# Patient Record
Sex: Male | Born: 1990
Health system: Southern US, Community
[De-identification: ages and names within clinical notes are randomized; demographics above are authoritative.]

## PROBLEM LIST (undated history)

## (undated) DIAGNOSIS — R011 Cardiac murmur, unspecified: Secondary | ICD-10-CM

## (undated) DIAGNOSIS — T7840XA Allergy, unspecified, initial encounter: Secondary | ICD-10-CM

## (undated) DIAGNOSIS — N179 Acute kidney failure, unspecified: Secondary | ICD-10-CM

## (undated) HISTORY — DX: Cardiac murmur, unspecified: R01.1

## (undated) HISTORY — DX: Acute kidney failure, unspecified: N17.9

## (undated) HISTORY — DX: Allergy, unspecified, initial encounter: T78.40XA

---
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
## 2011-09-19 ENCOUNTER — Ambulatory Visit (INDEPENDENT_AMBULATORY_CARE_PROVIDER_SITE_OTHER): Payer: BC Managed Care – PPO | Admitting: Family Medicine

## 2011-09-19 ENCOUNTER — Ambulatory Visit: Payer: BC Managed Care – PPO

## 2011-09-19 VITALS — BP 103/69 | HR 75 | Temp 98.5°F | Resp 16 | Ht 67.0 in | Wt 142.0 lb

## 2011-09-19 DIAGNOSIS — IMO0002 Reserved for concepts with insufficient information to code with codable children: Secondary | ICD-10-CM

## 2011-09-19 DIAGNOSIS — S86912A Strain of unspecified muscle(s) and tendon(s) at lower leg level, left leg, initial encounter: Secondary | ICD-10-CM

## 2011-09-19 DIAGNOSIS — M25562 Pain in left knee: Secondary | ICD-10-CM

## 2011-09-19 DIAGNOSIS — M25569 Pain in unspecified knee: Secondary | ICD-10-CM

## 2011-09-19 MED ORDER — MELOXICAM 15 MG PO TABS: 15.0000 mg | ORAL_TABLET | Freq: Every day | ORAL | Status: AC

## 2011-09-19 NOTE — Progress Notes (Signed)
  Subjective:    Patient ID: Micheal Wilson, male    DOB: 04/22/1990, 21 y.o.   MRN: [id number]  HPI  Pt presents to clinic with L knee pain for 2 days - he was sitting and he went to standing position and felt a pop and had immediate swelling in his L knee.  Since then the pain has improved and the swelling has reduced but he is concerned because it is still feeling like it is going to give away.  He has never had injury to the knee before.  He has used no medications.  Review of Systems  Musculoskeletal: Positive for joint swelling, arthralgias and gait problem.       Objective:   Physical Exam  Vitals reviewed. Constitutional: He is oriented to person, place, and time. He appears well-developed and well-nourished.  HENT:  Head: Normocephalic and atraumatic.  Right Ear: External ear normal.  Left Ear: External ear normal.  Pulmonary/Chest: Effort normal.  Musculoskeletal:       Left knee: He exhibits effusion (mild). He exhibits normal range of motion, no erythema, no LCL laxity, normal patellar mobility, no bony tenderness, normal meniscus and no MCL laxity. tenderness found. Medial joint line (mild TTP) and patellar tendon (mild TTP) tenderness noted. No lateral joint line, no MCL and no LCL tenderness noted.  Neurological: He is alert and oriented to person, place, and time.  Skin: Skin is warm and dry.  Psychiatric: He has a normal mood and affect. His behavior is normal. Thought content normal.    [facility] reading (PRIMARY) by  Dr. Milus Glazier.  Nl xray.        Assessment & Plan:   1. Knee pain, left  DG Knee Complete 4 Views Left, meloxicam (MOBIC) 15 MG tablet  2. Strain of knee and leg, left     Pt to rest knee and use ice prn.

## 2011-10-21 ENCOUNTER — Ambulatory Visit (INDEPENDENT_AMBULATORY_CARE_PROVIDER_SITE_OTHER): Payer: BC Managed Care – PPO | Admitting: Emergency Medicine

## 2011-10-21 VITALS — BP 91/56 | HR 58 | Temp 98.3°F | Resp 16 | Ht 66.0 in | Wt 142.0 lb

## 2011-10-21 DIAGNOSIS — J019 Acute sinusitis, unspecified: Secondary | ICD-10-CM

## 2011-10-21 DIAGNOSIS — J029 Acute pharyngitis, unspecified: Secondary | ICD-10-CM

## 2011-10-21 LAB — POCT RAPID STREP A (OFFICE): Rapid Strep A Screen: NEGATIVE

## 2011-10-21 MED ORDER — AMOXICILLIN 875 MG PO TABS: 875.0000 mg | ORAL_TABLET | Freq: Two times a day (BID) | ORAL | Status: AC

## 2011-10-21 MED ORDER — FLUTICASONE PROPIONATE 50 MCG/ACT NA SUSP: 2.0000 | Freq: Every day | NASAL | Status: DC

## 2011-10-21 NOTE — Patient Instructions (Signed)

## 2011-10-21 NOTE — Progress Notes (Signed)
  Subjective:    Patient ID: Micheal Wilson, male    DOB: 1990/05/18, 21 y.o.   MRN: [id number]  HPI patient states that every year this time a year he has problems. 2 sinus congestion and then develops tonsillar swelling. He has been blowing purulent drainage out of his nose and also coughing up small amounts of yellowish phlegm   Review of Systems     Objective:   Physical Exam TMs are clear. Nose is congested. Tonsils are 2+ and symmetrical anterior posterior cervical nodes are nonenlarged. Is clear to both auscultation and percussion   Results for orders placed in visit on 10/21/11  POCT RAPID STREP A (OFFICE)      Component Value Range   Rapid Strep A Screen Negative  Negative       Assessment & Plan:  Patient seems to start with allergic rhinitis and then develops a sinusitis. Will cover with amoxicillin twice a day have also I have prescribed  Flonase which he has taken in the past but does not like to use.

## 2012-09-21 ENCOUNTER — Ambulatory Visit: Payer: BC Managed Care – PPO | Admitting: Physician Assistant

## 2012-09-21 VITALS — BP 102/58 | HR 70 | Temp 97.9°F | Resp 16 | Ht 67.0 in | Wt 145.6 lb

## 2012-09-21 DIAGNOSIS — J029 Acute pharyngitis, unspecified: Secondary | ICD-10-CM

## 2012-09-21 MED ORDER — AMOXICILLIN 875 MG PO TABS: 875.0000 mg | ORAL_TABLET | Freq: Two times a day (BID) | ORAL | Status: DC

## 2012-09-21 NOTE — Progress Notes (Signed)
  Subjective:    Patient ID: Micheal Wilson, male    DOB: [birth year], 22 y.o.   MRN: [id number]  HPI 22 year old male presents with 6 day history of sore throat, slight nasal drainage, and mild, dry cough.  States symptoms started 2 days after an exposure to strep. Admits he has had chills and subjective fever.  Denies otalgia, nausea, vomiting, sinus pain, headache, SOB, or wheezing.  He has not taken any OTC medications for this.  No significant hx of strep since childhood. Otherwise healthy with no other concerns today.     Review of Systems  Constitutional: Positive for fever (subjective) and chills.  HENT: Positive for sore throat, rhinorrhea and postnasal drip. Negative for ear pain, congestion and neck stiffness.   Respiratory: Positive for cough. Negative for shortness of breath and wheezing.   Gastrointestinal: Negative for nausea, vomiting and abdominal pain.  Neurological: Negative for headaches.       Objective:   Physical Exam  Constitutional: He is oriented to person, place, and time. He appears well-developed and well-nourished.  HENT:  Head: Normocephalic and atraumatic.  Right Ear: Hearing, tympanic membrane, external ear and ear canal normal.  Left Ear: Hearing, tympanic membrane, external ear and ear canal normal.  Mouth/Throat: Uvula is midline and mucous membranes are normal. Posterior oropharyngeal erythema present. No oropharyngeal exudate or tonsillar abscesses.  Eyes: Conjunctivae are normal.  Neck: Normal range of motion. Neck supple.  Cardiovascular: Normal rate, regular rhythm and normal heart sounds.   Pulmonary/Chest: Effort normal and breath sounds normal.  Lymphadenopathy:    He has no cervical adenopathy.  Neurological: He is alert and oriented to person, place, and time.  Psychiatric: He has a normal mood and affect. His behavior is normal. Judgment and thought content normal.          Assessment & Plan:  Acute pharyngitis - Plan: amoxicillin  (AMOXIL) 875 MG tablet  Patient declined strep test due to cost Start amoxicillin 875 mg bid x 10 days Increase fluids and rest Out of work today. Ok to go tomorrow as long as he is afebrile Follow up if symptoms worsen or fail to improve.

## 2012-10-10 ENCOUNTER — Ambulatory Visit (INDEPENDENT_AMBULATORY_CARE_PROVIDER_SITE_OTHER): Payer: BC Managed Care – PPO | Admitting: Emergency Medicine

## 2012-10-10 ENCOUNTER — Ambulatory Visit: Payer: BC Managed Care – PPO

## 2012-10-10 VITALS — BP 110/70 | HR 80 | Temp 97.9°F | Resp 16 | Ht 66.75 in | Wt 150.6 lb

## 2012-10-10 DIAGNOSIS — S139XXA Sprain of joints and ligaments of unspecified parts of neck, initial encounter: Secondary | ICD-10-CM

## 2012-10-10 DIAGNOSIS — K5900 Constipation, unspecified: Secondary | ICD-10-CM

## 2012-10-10 DIAGNOSIS — S335XXA Sprain of ligaments of lumbar spine, initial encounter: Secondary | ICD-10-CM

## 2012-10-10 DIAGNOSIS — S239XXA Sprain of unspecified parts of thorax, initial encounter: Secondary | ICD-10-CM

## 2012-10-10 MED ORDER — NAPROXEN SODIUM 550 MG PO TABS: 550.0000 mg | ORAL_TABLET | Freq: Two times a day (BID) | ORAL | Status: DC

## 2012-10-10 MED ORDER — CYCLOBENZAPRINE HCL 10 MG PO TABS: 10.0000 mg | ORAL_TABLET | Freq: Three times a day (TID) | ORAL | Status: DC | PRN

## 2012-10-10 MED ORDER — POLYETHYLENE GLYCOL 3350 17 GM/SCOOP PO POWD: 17.0000 g | Freq: Every day | ORAL | Status: DC
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
## 2012-10-10 NOTE — Patient Instructions (Addendum)
Cervical Sprain A cervical sprain is an injury in the neck in which the ligaments are stretched or torn. The ligaments are the tissues that hold the bones of the neck (vertebrae) in place.Cervical sprains can range from very mild to very severe. Most cervical sprains get better in 1 to 3 weeks, but it depends on the cause and extent of the injury. Severe cervical sprains can cause the neck vertebrae to be unstable. This can lead to damage of the spinal cord and can result in serious nervous system problems. Your caregiver will determine whether your cervical sprain is mild or severe. CAUSES  Severe cervical sprains may be caused by:  Contact sport injuries (football, rugby, wrestling, hockey, auto racing, gymnastics, diving, martial arts, boxing).  Motor vehicle collisions.  Whiplash injuries. This means the neck is forcefully whipped backward and forward.  Falls. Mild cervical sprains may be caused by:   Awkward positions, such as cradling a telephone between your ear and shoulder.  Sitting in a chair that does not offer proper support.  Working at a poorly designed computer station.  Activities that require looking up or down for long periods of time. SYMPTOMS   Pain, soreness, stiffness, or a burning sensation in the front, back, or sides of the neck. This discomfort may develop immediately after injury or it may develop slowly and not begin for 24 hours or more after an injury.  Pain or tenderness directly in the middle of the back of the neck.  Shoulder or upper back pain.  Limited ability to move the neck.  Headache.  Dizziness.  Weakness, numbness, or tingling in the hands or arms.  Muscle spasms.  Difficulty swallowing or chewing.  Tenderness and swelling of the neck. DIAGNOSIS  Most of the time, your caregiver can diagnose this problem by taking your history and doing a physical exam. Your caregiver will ask about any known problems, such as arthritis in the neck  or a previous neck injury. X-rays may be taken to find out if there are any other problems, such as problems with the bones of the neck. However, an X-ray often does not reveal the full extent of a cervical sprain. Other tests such as a computed tomography (CT) scan or magnetic resonance imaging (MRI) may be needed. TREATMENT  Treatment depends on the severity of the cervical sprain. Mild sprains can be treated with rest, keeping the neck in place (immobilization), and pain medicines. Severe cervical sprains need immediate immobilization and an appointment with an orthopedist or neurosurgeon. Several treatment options are available to help with pain, muscle spasms, and other symptoms. Your caregiver may prescribe:  Medicines, such as pain relievers, numbing medicines, or muscle relaxants.  Physical therapy. This can include stretching exercises, strengthening exercises, and posture training. Exercises and improved posture can help stabilize the neck, strengthen muscles, and help stop symptoms from returning.  A neck collar to be worn for short periods of time. Often, these collars are worn for comfort. However, certain collars may be worn to protect the neck and prevent further worsening of a serious cervical sprain. HOME CARE INSTRUCTIONS   Put ice on the injured area.  Put ice in a plastic bag.  Place a towel between your skin and the bag.  Leave the ice on for 15-20 minutes, 3-4 times a day.  Only take over-the-counter or prescription medicines for pain, discomfort, or fever as directed by your caregiver.  Keep all follow-up appointments as directed by your caregiver.  Keep all   physical therapy appointments as directed by your caregiver.  If a neck collar is prescribed, wear it as directed by your caregiver.  Do not drive while wearing a neck collar.  Make any needed adjustments to your work station to promote good posture.  Avoid positions and activities that make your symptoms  worse.  Warm up and stretch before being active to help prevent problems. SEEK MEDICAL CARE IF:   Your pain is not controlled with medicine.  You are unable to decrease your pain medicine over time as planned.  Your activity level is not improving as expected. SEEK IMMEDIATE MEDICAL CARE IF:   You develop any bleeding, stomach upset, or signs of an allergic reaction to your medicine.  Your symptoms get worse.  You develop new, unexplained symptoms.  You have numbness, tingling, weakness, or paralysis in any part of your body. MAKE SURE YOU:   Understand these instructions.  Will watch your condition.  Will get help right away if you are not doing well or get worse. Document Released: 11/30/2006 Document Revised: 04/27/2011 Document Reviewed: 11/05/2010 [facility] Patient Information 2014 [place], [place]. Constipation, Adult Constipation is when a person has fewer than 3 bowel movements a week; has difficulty having a bowel movement; or has stools that are dry, hard, or larger than normal. As people grow older, constipation is more common. If you try to fix constipation with medicines that make you have a bowel movement (laxatives), the problem may get worse. Long-term laxative use may cause the muscles of the colon to become weak. A low-fiber diet, not taking in enough fluids, and taking certain medicines may make constipation worse. CAUSES   Certain medicines, such as antidepressants, pain medicine, iron supplements, antacids, and water pills.   Certain diseases, such as diabetes, irritable bowel syndrome (IBS), thyroid disease, or depression.   Not drinking enough water.   Not eating enough fiber-rich foods.   Stress or travel.  Lack of physical activity or exercise.  Not going to the restroom when there is the urge to have a bowel movement.  Ignoring the urge to have a bowel movement.  Using laxatives too much. SYMPTOMS   Having fewer than 3 bowel movements a  week.   Straining to have a bowel movement.   Having hard, dry, or larger than normal stools.   Feeling full or bloated.   Pain in the lower abdomen.  Not feeling relief after having a bowel movement. DIAGNOSIS  Your caregiver will take a medical history and perform a physical exam. Further testing may be done for severe constipation. Some tests may include:   A barium enema X-ray to examine your rectum, colon, and sometimes, your small intestine.  A sigmoidoscopy to examine your lower colon.  A colonoscopy to examine your entire colon. TREATMENT  Treatment will depend on the severity of your constipation and what is causing it. Some dietary treatments include drinking more fluids and eating more fiber-rich foods. Lifestyle treatments may include regular exercise. If these diet and lifestyle recommendations do not help, your caregiver may recommend taking over-the-counter laxative medicines to help you have bowel movements. Prescription medicines may be prescribed if over-the-counter medicines do not work.  HOME CARE INSTRUCTIONS   Increase dietary fiber in your diet, such as fruits, vegetables, whole grains, and beans. Limit high-fat and processed sugars in your diet, such as [place] fries, hamburgers, cookies, candies, and soda.   A fiber supplement may be added to your diet if you cannot get enough fiber  from foods.   Drink enough fluids to keep your urine clear or pale yellow.   Exercise regularly or as directed by your caregiver.   Go to the restroom when you have the urge to go. Do not hold it.  Only take medicines as directed by your caregiver. Do not take other medicines for constipation without talking to your caregiver first. SEEK IMMEDIATE MEDICAL CARE IF:   You have bright red blood in your stool.   Your constipation lasts for more than 4 days or gets worse.   You have abdominal or rectal pain.   You have thin, pencil-like stools.  You have  unexplained weight loss. MAKE SURE YOU:   Understand these instructions.  Will watch your condition.  Will get help right away if you are not doing well or get worse. Document Released: 11/01/2003 Document Revised: 04/27/2011 Document Reviewed: 01/06/2011 [facility] Patient Information 2014 [place], [place].

## 2012-10-24 ENCOUNTER — Ambulatory Visit (INDEPENDENT_AMBULATORY_CARE_PROVIDER_SITE_OTHER): Payer: BC Managed Care – PPO | Admitting: Family Medicine

## 2012-10-24 VITALS — BP 112/74 | HR 71 | Temp 99.3°F | Resp 16 | Ht 67.0 in | Wt 145.0 lb

## 2012-10-24 DIAGNOSIS — R197 Diarrhea, unspecified: Secondary | ICD-10-CM

## 2012-10-24 DIAGNOSIS — M545 Low back pain, unspecified: Secondary | ICD-10-CM

## 2012-10-24 DIAGNOSIS — R109 Unspecified abdominal pain: Secondary | ICD-10-CM

## 2012-10-24 DIAGNOSIS — K529 Noninfective gastroenteritis and colitis, unspecified: Secondary | ICD-10-CM

## 2012-10-24 DIAGNOSIS — Z202 Contact with and (suspected) exposure to infections with a predominantly sexual mode of transmission: Secondary | ICD-10-CM

## 2012-10-24 DIAGNOSIS — K921 Melena: Secondary | ICD-10-CM

## 2012-10-24 DIAGNOSIS — Z2089 Contact with and (suspected) exposure to other communicable diseases: Secondary | ICD-10-CM

## 2012-10-24 DIAGNOSIS — R51 Headache: Secondary | ICD-10-CM

## 2012-10-24 DIAGNOSIS — K5289 Other specified noninfective gastroenteritis and colitis: Secondary | ICD-10-CM

## 2012-10-24 DIAGNOSIS — J029 Acute pharyngitis, unspecified: Secondary | ICD-10-CM

## 2012-10-24 LAB — POCT URINALYSIS DIPSTICK
Bilirubin, UA: NEGATIVE
Glucose, UA: NEGATIVE
Leukocytes, UA: NEGATIVE
Nitrite, UA: NEGATIVE

## 2012-10-24 LAB — POCT CBC
MCH, POC: 30.7 pg (ref 27–31.2)
MCV: 92.3 fL (ref 80–97)
MID (cbc): 0.7 (ref 0–0.9)
POC LYMPH PERCENT: 20.9 %L (ref 10–50)
Platelet Count, POC: 181 10*3/uL (ref 142–424)
RBC: 5.18 M/uL (ref 4.69–6.13)
WBC: 9.6 10*3/uL (ref 4.6–10.2)

## 2012-10-24 LAB — POCT UA - MICROSCOPIC ONLY
Bacteria, U Microscopic: NEGATIVE
RBC, urine, microscopic: NEGATIVE
WBC, Ur, HPF, POC: NEGATIVE

## 2012-10-24 LAB — IFOBT (OCCULT BLOOD): IFOBT: POSITIVE

## 2012-10-24 MED ORDER — TRAMADOL HCL 50 MG PO TABS: 50.0000 mg | ORAL_TABLET | Freq: Three times a day (TID) | ORAL | Status: DC | PRN

## 2012-10-24 MED ORDER — PROMETHAZINE HCL 25 MG PO TABS: 25.0000 mg | ORAL_TABLET | Freq: Three times a day (TID) | ORAL | Status: DC | PRN
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
## 2012-10-24 NOTE — Patient Instructions (Addendum)
Your urine tests are normal and you do not have any signs of infection in your blood but you do have blood in your stool.  I am wondering if you have infection or inflammation as a cause of your current illness.  Lets treat your nausea and your pain. Almost all of these are viral and resolve within several days on their own if you can keep well hydrated. However, it would be good for you to come back to make sure that the microscopic blood in your stool clears up.  If not, we should refer you to a GI doctor for further evaluation and consider further testing in the meantime.  Viral Gastroenteritis Viral gastroenteritis is also known as stomach flu. This condition affects the stomach and intestinal tract. It can cause sudden diarrhea and vomiting. The illness typically lasts 3 to 8 days. Most people develop an immune response that eventually gets rid of the virus. While this natural response develops, the virus can make you quite ill. CAUSES  Many different viruses can cause gastroenteritis, such as rotavirus or noroviruses. You can catch one of these viruses by consuming contaminated food or water. You may also catch a virus by sharing utensils or other personal items with an infected person or by touching a contaminated surface. SYMPTOMS  The most common symptoms are diarrhea and vomiting. These problems can cause a severe loss of body fluids (dehydration) and a body salt (electrolyte) imbalance. Other symptoms may include:  Fever.  Headache.  Fatigue.  Abdominal pain. DIAGNOSIS  Your caregiver can usually diagnose viral gastroenteritis based on your symptoms and a physical exam. A stool sample may also be taken to test for the presence of viruses or other infections. TREATMENT  This illness typically goes away on its own. Treatments are aimed at rehydration. The most serious cases of viral gastroenteritis involve vomiting so severely that you are not able to keep fluids down. In these cases,  fluids must be given through an intravenous line (IV). HOME CARE INSTRUCTIONS   Drink enough fluids to keep your urine clear or pale yellow. Drink small amounts of fluids frequently and increase the amounts as tolerated.  Ask your caregiver for specific rehydration instructions.  Avoid:  Foods high in sugar.  Alcohol.  Carbonated drinks.  Tobacco.  Juice.  Caffeine drinks.  Extremely hot or cold fluids.  Fatty, greasy foods.  Too much intake of anything at one time.  Dairy products until 24 to 48 hours after diarrhea stops.  You may consume probiotics. Probiotics are active cultures of beneficial bacteria. They may lessen the amount and number of diarrheal stools in adults. Probiotics can be found in yogurt with active cultures and in supplements.  Wash your hands well to avoid spreading the virus.  Only take over-the-counter or prescription medicines for pain, discomfort, or fever as directed by your caregiver. Do not give aspirin to children. Antidiarrheal medicines are not recommended.  Ask your caregiver if you should continue to take your regular prescribed and over-the-counter medicines.  Keep all follow-up appointments as directed by your caregiver. SEEK IMMEDIATE MEDICAL CARE IF:   You are unable to keep fluids down.  You do not urinate at least once every 6 to 8 hours.  You develop shortness of breath.  You notice blood in your stool or vomit. This may look like coffee grounds.  You have abdominal pain that increases or is concentrated in one small area (localized).  You have persistent vomiting or diarrhea.  You  have a fever.  The patient is a child younger than 3 months, and he or she has a fever.  The patient is a child older than 3 months, and he or she has a fever and persistent symptoms.  The patient is a child older than 3 months, and he or she has a fever and symptoms suddenly get worse.  The patient is a baby, and he or she has no tears  when crying. MAKE SURE YOU:   Understand these instructions.  Will watch your condition.  Will get help right away if you are not doing well or get worse. Document Released: 02/02/2005 Document Revised: 04/27/2011 Document Reviewed: 11/19/2010 [facility] Patient Information 2014 [place], [place].

## 2012-10-25 LAB — COMPREHENSIVE METABOLIC PANEL
AST: 26 U/L (ref 0–37)
BUN: 17 mg/dL (ref 6–23)
Calcium: 10.2 mg/dL (ref 8.4–10.5)
Chloride: 105 mEq/L (ref 96–112)
Creat: 3.18 mg/dL — ABNORMAL HIGH (ref 0.50–1.35)

## 2012-10-25 LAB — LIPASE: Lipase: 14 U/L (ref 0–75)

## 2012-10-25 LAB — HIV ANTIBODY (ROUTINE TESTING W REFLEX): HIV: NONREACTIVE

## 2012-10-25 LAB — EPSTEIN-BARR VIRUS VCA ANTIBODY PANEL
EBV NA IgG: 122 U/mL — ABNORMAL HIGH (ref <18.0)
EBV VCA IgG: 24.6 U/mL — ABNORMAL HIGH (ref <18.0)

## 2012-10-25 LAB — HEPATITIS C ANTIBODY: HCV Ab: NEGATIVE

## 2012-10-27 LAB — GC/CHLAMYDIA PROBE AMP: GC Probe RNA: NEGATIVE

## 2012-10-29 ENCOUNTER — Encounter: Payer: Self-pay | Admitting: Family Medicine

## 2012-10-29 ENCOUNTER — Telehealth: Payer: Self-pay | Admitting: Family Medicine

## 2012-10-29 NOTE — Telephone Encounter (Signed)
Spoke with patient patient went to [facility] Tuesday 10-25-12 and they treated him he has to follow back up with primary doctor Tuesday 11-01-12

## 2012-11-01 DIAGNOSIS — N179 Acute kidney failure, unspecified: Secondary | ICD-10-CM | POA: Insufficient documentation
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
## 2017-10-25 ENCOUNTER — Ambulatory Visit: Payer: 59 | Admitting: Family Medicine

## 2017-10-25 ENCOUNTER — Encounter: Payer: Self-pay | Admitting: Family Medicine

## 2017-10-25 VITALS — BP 110/70 | HR 77 | Temp 98.0°F | Ht 67.25 in | Wt 153.4 lb

## 2017-10-25 DIAGNOSIS — Z8639 Personal history of other endocrine, nutritional and metabolic disease: Secondary | ICD-10-CM | POA: Diagnosis not present

## 2017-10-25 DIAGNOSIS — Z0000 Encounter for general adult medical examination without abnormal findings: Secondary | ICD-10-CM

## 2017-10-25 DIAGNOSIS — Z23 Encounter for immunization: Secondary | ICD-10-CM

## 2017-10-25 LAB — CBC
HCT: 44.4 % (ref 39.0–52.0)
Hemoglobin: 15.8 g/dL (ref 13.0–17.0)
MCHC: 35.6 g/dL (ref 30.0–36.0)
MCV: 82.7 fl (ref 78.0–100.0)
PLATELETS: 208 10*3/uL (ref 150.0–400.0)
RBC: 5.38 Mil/uL (ref 4.22–5.81)
RDW: 13 % (ref 11.5–15.5)
WBC: 5.6 10*3/uL (ref 4.0–10.5)

## 2017-10-25 LAB — COMPREHENSIVE METABOLIC PANEL
ALK PHOS: 75 U/L (ref 39–117)
ALT: 60 U/L — AB (ref 0–53)
AST: 33 U/L (ref 0–37)
Albumin: 4.6 g/dL (ref 3.5–5.2)
BILIRUBIN TOTAL: 0.7 mg/dL (ref 0.2–1.2)
BUN: 9 mg/dL (ref 6–23)
CO2: 29 mEq/L (ref 19–32)
CREATININE: 1.18 mg/dL (ref 0.40–1.50)
Calcium: 9.6 mg/dL (ref 8.4–10.5)
Chloride: 105 mEq/L (ref 96–112)
GFR: 77.79 mL/min (ref >60.00)
GLUCOSE: 106 mg/dL — AB (ref 70–99)
Potassium: 4.4 mEq/L (ref 3.5–5.1)
Sodium: 139 mEq/L (ref 135–145)
TOTAL PROTEIN: 7.2 g/dL (ref 6.0–8.3)

## 2017-10-25 LAB — VITAMIN B12: VITAMIN B 12: 628 pg/mL (ref 211–911)

## 2017-10-25 LAB — TSH: TSH: 2.33 u[IU]/mL (ref 0.35–4.50)

## 2017-10-25 NOTE — Assessment & Plan Note (Signed)
Well adult Orders Placed This Encounter  Procedures  . Flu Vaccine QUAD 6+ mos PF IM (Fluarix Quad PF)  . Tdap vaccine greater than or equal to 28yo IM  . Comp Met (CMET)  . CBC  . TSH  . B12  Immunizations: Tdap and influenza Screenings:  None indicated at this time.  Anticipatory guidance/Risk factor reduction:  Per AVS
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
## 2017-10-25 NOTE — Patient Instructions (Signed)

## 2017-10-28 NOTE — Progress Notes (Signed)
-  Mild elevation in liver enzymes.  Would recommend low fat diet and limiting any EtOH intake.  -Other labs look ok.

## 2018-01-27 ENCOUNTER — Ambulatory Visit: Payer: 59 | Admitting: Family Medicine

## 2018-01-27 ENCOUNTER — Ambulatory Visit: Payer: Self-pay

## 2018-01-27 ENCOUNTER — Encounter: Payer: Self-pay | Admitting: Family Medicine

## 2018-01-27 ENCOUNTER — Telehealth: Payer: Self-pay | Admitting: Family Medicine

## 2018-01-27 VITALS — BP 122/74 | HR 85 | Temp 98.5°F | Ht 67.25 in | Wt 154.0 lb

## 2018-01-27 DIAGNOSIS — F4322 Adjustment disorder with anxiety: Secondary | ICD-10-CM | POA: Diagnosis not present

## 2018-01-27 DIAGNOSIS — K120 Recurrent oral aphthae: Secondary | ICD-10-CM | POA: Diagnosis not present

## 2018-01-27 DIAGNOSIS — J0100 Acute maxillary sinusitis, unspecified: Secondary | ICD-10-CM | POA: Diagnosis not present

## 2018-01-27 MED ORDER — AZITHROMYCIN 250 MG PO TABS: ORAL_TABLET | ORAL | 0 refills | Status: DC

## 2018-01-27 MED ORDER — MAGIC MOUTHWASH: 5.0000 mL | Freq: Three times a day (TID) | ORAL | 0 refills | Status: DC | PRN

## 2018-01-27 MED ORDER — ALPRAZOLAM 0.5 MG PO TABS: 0.2500 mg | ORAL_TABLET | Freq: Every evening | ORAL | 0 refills | Status: DC | PRN

## 2018-01-27 MED FILL — ALPRAZolam 0.5 MG TABS: 0.5 | 30 days supply | Qty: 30 | Fill #0

## 2018-01-27 MED FILL — DUKE'S MOUTHWASH: 10 days supply | Qty: 150 | Fill #0

## 2018-01-27 MED FILL — AZITHROMYCIN 250 MG TABLET: 250 | 5 days supply | Qty: 6 | Fill #0

## 2018-01-27 NOTE — Assessment & Plan Note (Signed)
-  Rx for magic mouthwash -Likely related to stress.

## 2018-01-27 NOTE — Telephone Encounter (Signed)
Provided information with pharmacy tech will fill [facility] Magic Mouthwash.

## 2018-01-27 NOTE — Telephone Encounter (Signed)
Copied from CRM [phone]. Topic: Quick Communication - Rx Refill/Question >> Jan 27, 2018 11:08 AM Stephannie LiSimmons, Tamber Burtch L, NT wrote: Medication: magic mouthwash SOLN , ( the pharmacy called and would like to know which ingredients to use for the mouth wash , ) please advise   Has the patient contacted their pharmacy? yes  (Agent: If no, request that the patient contact the pharmacy for the refill. (Agent: If yes, when and what did the pharmacy advise?  Preferred Pharmacy (with phone number or street name [facility] [place] [address] [phone] (Phone) [phone] (Fax)    Agent: Please be advised that RX refills may take up to 3 business days. We ask that you follow-up with your pharmacy.

## 2018-01-27 NOTE — Progress Notes (Signed)
Micheal Kothari - 27 y.o. male MRN [id number]  Date of birth: 11/11/1990  Subjective Chief Complaint  Patient presents with  . Sinusitis    Ongong for one week-blisters in mouth and sore throat.  Marland Kitchen. Anxiety    Son has been in NICU-can't sleep    HPI Micheal Wilson is a 27 y.o. male here today with complaint of:  -Sinus pain:  Reports sinus pain and congestion for the past several days.  Pain located over maxillary sinuses. Has had thick, yellow drainage.  He denies fever, chills, chest pain, shortness of breath, body aches.   -Anxiety:  Son was born at 6126 weeks and [facility].  Has had significant anxiety and difficulty sleeping since this time.  Unable to stop thinking about his son to fall asleep.  Anxiety is a little better during the day when he is working as this takes his mind off things some.  He is having some tightness in his neck and has ulcers on his tongue that he thinks may be related to stress.   ROS:  A comprehensive ROS was completed and negative except as noted per HPI  Allergies  Allergen Reactions  . Omnicef [Cefdinir] Swelling    Lymph nodes swell    Past Medical History:  Diagnosis Date  . Acute kidney injury (HCC)   . Allergy   . Heart murmur     Past Surgical History:  Procedure Laterality Date  . HERNIA REPAIR  1991    Social History   Socioeconomic History  . Marital status: Married    Spouse name: Not on file  . Number of children: Not on file  . Years of education: Not on file  . Highest education level: Not on file  Occupational History  . Not on file  Social Needs  . Financial resource strain: Not on file  . Food insecurity:    Worry: Not on file    Inability: Not on file  . Transportation needs:    Medical: Not on file    Non-medical: Not on file  Tobacco Use  . Smoking status: Former Smoker    Packs/day: 1.00    Years: 0.90    Pack years: 0.90    Types: Pipe  . Smokeless tobacco: Never Used  Substance and  Sexual Activity  . Alcohol use: Yes    Comment: rarely  . Drug use: No  . Sexual activity: Not on file  Lifestyle  . Physical activity:    Days per week: Not on file    Minutes per session: Not on file  . Stress: Not on file  Relationships  . Social connections:    Talks on phone: Not on file    Gets together: Not on file    Attends religious service: Not on file    Active member of club or organization: Not on file    Attends meetings of clubs or organizations: Not on file    Relationship status: Not on file  Other Topics Concern  . Not on file  Social History Narrative   [place]   Works as a Company secretaryreelancer    Family History  Problem Relation Age of Onset  . Diabetes Mother   . Asthma Mother   . Hypertension Mother   . Miscarriages / [place] Mother   . Hypertension Father   . Kidney disease Father   . Asthma Maternal Grandmother   . Hyperlipidemia Maternal Grandmother   .  Hypertension Maternal Grandmother   . Heart disease Paternal Grandfather   . Hyperlipidemia Paternal Grandfather   . Hypertension Paternal Grandfather   . Kidney disease Paternal Grandfather     Health Maintenance  Topic Date Due  . TETANUS/TDAP  10/26/2027  . INFLUENZA VACCINE  Completed  . HIV Screening  Completed    ----------------------------------------------------------------------------------------------------------------------------------------------------------------------------------------------------------------- Physical Exam BP 122/74   Pulse 85   Temp 98.5 F (36.9 C) (Oral)   Ht 5' 7.25" (1.708 m)   Wt 154 lb (69.9 kg)   SpO2 97%   BMI 23.94 kg/m   Physical Exam Constitutional:      Appearance: He is not ill-appearing.  HENT:     Head: Normocephalic and atraumatic.     Right Ear: Tympanic membrane and ear canal normal.     Left Ear: Tympanic membrane and ear canal normal.     Nose: Congestion present.     Comments: Maxillary sinus tenderness.      Mouth/Throat:     Comments: Ulcerations on tongue  Eyes:     General: No scleral icterus. Neck:     Musculoskeletal: Neck supple. No muscular tenderness.  Cardiovascular:     Rate and Rhythm: Normal rate and regular rhythm.     Heart sounds: No murmur.  Pulmonary:     Effort: Pulmonary effort is normal.     Breath sounds: Normal breath sounds.  Skin:    General: Skin is warm and dry.  Neurological:     Mental Status: He is alert.  Psychiatric:        Behavior: Behavior normal.     Comments: Anxious and fatigued appearing.      ------------------------------------------------------------------------------------------------------------------------------------------------------------------------------------------------------------------- Assessment and Plan  Adjustment disorder with anxiety -Rx for alprazolam as needed in the evenings for sleep or anxiety.  -Discussed will try to leave him on this for shortest time possible.   Acute maxillary sinusitis -Omnicef allergy noted, will avoid PCN due to potential cross-reactivity.  -Rx for azithromycin -Push fluids.   Canker sore -Rx for magic mouthwash -Likely related to stress.

## 2018-01-27 NOTE — Telephone Encounter (Signed)
  Returned call to patient who states he was seen today for a sinus infection. He was given an RX of Azithromycin after taking the 1st does he developed upper abdominal cramping vomiting and diarrhea. Pt has vomited 2 times and is vomiting as we speak. He states when he got home from work he decided to eat a little but vomited up again. He is drinking water. He is urinating. Appointment scheduled per protocol. Pt will not take the azithromycin until he see Dr Ashley RoyaltyMatthews. Care advice read to patient.  Pt verbalized understanding. Pt was advised for symptom relief to seek care at urgent care. Reason for Disposition . [1] MILD or MODERATE vomiting AND [2] present > 48 hours (2 days) (Exception: mild vomiting with associated diarrhea)  Answer Assessment - Initial Assessment Questions 1. VOMITING SEVERITY: "How many times have you vomited in the past 24 hours?"     - MILD:  1 - 2 times/day    - MODERATE: 3 - 5 times/day, decreased oral intake without significant weight loss or symptoms of dehydration    - SEVERE: 6 or more times/day, vomits everything or nearly everything, with significant weight loss, symptoms of dehydration      mild 2. ONSET: "When did the vomiting begin?"      Right after taking antibiotic 3. FLUIDS: "What fluids or food have you vomited up today?" "Have you been able to keep any fluids down?"     water 4. ABDOMINAL PAIN: "Are your having any abdominal pain?" If yes : "How bad is it and what does it feel like?" (e.g., crampy, dull, intermittent, constant)      Cramping off and on upper 5. DIARRHEA: "Is there any diarrhea?" If so, ask: "How many times today?"      diarrhea 6. CONTACTS: "Is there anyone else in the family with the same symptoms?"      no 7. CAUSE: "What do you think is causing your vomiting?"     z pack 8. HYDRATION STATUS: "Any signs of dehydration?" (e.g., dry mouth [not only dry lips], too weak to stand) "When did you last urinate?"     Just a few minutes  ago 9. OTHER SYMPTOMS: "Do you have any other symptoms?" (e.g., fever, headache, vertigo, vomiting blood or coffee grounds, recent head injury)     no 10. PREGNANCY: "Is there any chance you are pregnant?" "When was your last menstrual period?"       N/A  Protocols used: [facility]

## 2018-01-27 NOTE — Assessment & Plan Note (Signed)
-  Rx for alprazolam as needed in the evenings for sleep or anxiety.  -Discussed will try to leave him on this for shortest time possible.

## 2018-01-27 NOTE — Assessment & Plan Note (Signed)
-  Omnicef allergy noted, will avoid PCN due to potential cross-reactivity.  -Rx for azithromycin -Push fluids.

## 2018-01-27 NOTE — Patient Instructions (Signed)
Try alprazolam at night to help with sleep and anxiety. Avoid driving after taking.   Use mouthwash for ulcers.   Sinusitis, Adult Sinusitis is soreness and inflammation of your sinuses. Sinuses are hollow spaces in the bones around your face. They are located:  Around your eyes.  In the middle of your forehead.  Behind your nose.  In your cheekbones.  Your sinuses and nasal passages are lined with a stringy fluid (mucus). Mucus normally drains out of your sinuses. When your nasal tissues get inflamed or swollen, the mucus can get trapped or blocked so air cannot flow through your sinuses. This lets bacteria, viruses, and funguses grow, and that leads to infection. Follow these instructions at home: Medicines  Take, use, or apply over-the-counter and prescription medicines only as told by your doctor. These may include nasal sprays.  If you were prescribed an antibiotic medicine, take it as told by your doctor. Do not stop taking the antibiotic even if you start to feel better. Hydrate and Humidify  Drink enough water to keep your pee (urine) clear or pale yellow.  Use a cool mist humidifier to keep the humidity level in your home above 50%.  Breathe in steam for 10-15 minutes, 3-4 times a day or as told by your doctor. You can do this in the bathroom while a hot shower is running.  Try not to spend time in cool or dry air. Rest  Rest as much as possible.  Sleep with your head raised (elevated).  Make sure to get enough sleep each night. General instructions  Put a warm, moist washcloth on your face 3-4 times a day or as told by your doctor. This will help with discomfort.  Wash your hands often with soap and water. If there is no soap and water, use hand sanitizer.  Do not smoke. Avoid being around people who are smoking (secondhand smoke).  Keep all follow-up visits as told by your doctor. This is important. Contact a doctor if:  You have a fever.  Your symptoms  get worse.  Your symptoms do not get better within 10 days. Get help right away if:  You have a very bad headache.  You cannot stop throwing up (vomiting).  You have pain or swelling around your face or eyes.  You have trouble seeing.  You feel confused.  Your neck is stiff.  You have trouble breathing. This information is not intended to replace advice given to you by your health care provider. Make sure you discuss any questions you have with your health care provider. Document Released: 07/22/2007 Document Revised: 09/29/2015 Document Reviewed: 11/28/2014 Elsevier Interactive Patient Education  [employer]2018 Elsevier Inc.

## 2018-01-28 ENCOUNTER — Ambulatory Visit: Payer: 59 | Admitting: Family Medicine

## 2018-01-28 ENCOUNTER — Encounter: Payer: Self-pay | Admitting: Family Medicine

## 2018-01-28 DIAGNOSIS — R112 Nausea with vomiting, unspecified: Secondary | ICD-10-CM

## 2018-01-28 DIAGNOSIS — R1110 Vomiting, unspecified: Secondary | ICD-10-CM | POA: Insufficient documentation

## 2018-01-28 MED ORDER — DOXYCYCLINE HYCLATE 100 MG PO TABS: 100.0000 mg | ORAL_TABLET | Freq: Two times a day (BID) | ORAL | 0 refills | Status: DC

## 2018-01-28 MED FILL — DOXYCYCLINE HYCLATE 100 MG: 100 | 10 days supply | Qty: 20 | Fill #0

## 2018-01-28 NOTE — Progress Notes (Signed)
Micheal Wilson - 27 y.o. male MRN [id number]  Date of birth: 04/02/1990  Subjective Chief Complaint  Patient presents with  . Emesis    took one dose of azithromyosin-had vomiting and diarrhea    HPI Micheal Wilson is a 27 y.o. male here today with c/o vomiting and diarrhea.  Symptoms started after taking initial dose of azithromycin.  He reports that he feels fine today.  He denies further nausea or vomiting.  He has a normal appetite today.    ROS:  A comprehensive ROS was completed and negative except as noted per HPI  Allergies  Allergen Reactions  . Omnicef [Cefdinir] Swelling    Lymph nodes swell    Past Medical History:  Diagnosis Date  . Acute kidney injury (HCC)   . Allergy   . Heart murmur     Past Surgical History:  Procedure Laterality Date  . HERNIA REPAIR  1991    Social History   Socioeconomic History  . Marital status: Married    Spouse name: Not on file  . Number of children: Not on file  . Years of education: Not on file  . Highest education level: Not on file  Occupational History  . Not on file  Social Needs  . Financial resource strain: Not on file  . Food insecurity:    Worry: Not on file    Inability: Not on file  . Transportation needs:    Medical: Not on file    Non-medical: Not on file  Tobacco Use  . Smoking status: Former Smoker    Packs/day: 1.00    Years: 0.90    Pack years: 0.90    Types: Pipe  . Smokeless tobacco: Never Used  Substance and Sexual Activity  . Alcohol use: Yes    Comment: rarely  . Drug use: No  . Sexual activity: Not on file  Lifestyle  . Physical activity:    Days per week: Not on file    Minutes per session: Not on file  . Stress: Not on file  Relationships  . Social connections:    Talks on phone: Not on file    Gets together: Not on file    Attends religious service: Not on file    Active member of club or organization: Not on file    Attends meetings of clubs or organizations: Not on file   Relationship status: Not on file  Other Topics Concern  . Not on file  Social History Narrative   [place]   Works as a Company secretaryreelancer    Family History  Problem Relation Age of Onset  . Diabetes Mother   . Asthma Mother   . Hypertension Mother   . Miscarriages / [place] Mother   . Hypertension Father   . Kidney disease Father   . Asthma Maternal Grandmother   . Hyperlipidemia Maternal Grandmother   . Hypertension Maternal Grandmother   . Heart disease Paternal Grandfather   . Hyperlipidemia Paternal Grandfather   . Hypertension Paternal Grandfather   . Kidney disease Paternal Grandfather     Health Maintenance  Topic Date Due  . TETANUS/TDAP  10/26/2027  . INFLUENZA VACCINE  Completed  . HIV Screening  Completed    ----------------------------------------------------------------------------------------------------------------------------------------------------------------------------------------------------------------- Physical Exam BP 126/72   Pulse 72   Temp 98 F (36.7 C) (Oral)   Ht 5' 7.25" (1.708 m)   Wt 154 lb (69.9 kg)   SpO2 96%   BMI 23.94 kg/m  Physical Exam Constitutional:      Appearance: Normal appearance. He is not ill-appearing.  Cardiovascular:     Rate and Rhythm: Normal rate and regular rhythm.  Pulmonary:     Effort: Pulmonary effort is normal.     Breath sounds: Normal breath sounds.  Abdominal:     General: Bowel sounds are normal. There is no distension.     Palpations: Abdomen is soft.     Tenderness: There is no abdominal tenderness.  Skin:    General: Skin is warm and dry.     Findings: No rash.  Neurological:     Mental Status: He is alert.     ------------------------------------------------------------------------------------------------------------------------------------------------------------------------------------------------------------------- Assessment and Plan  Vomiting -Likely related  to Azithromycin, will d/c and start doxycycline.

## 2018-01-28 NOTE — Assessment & Plan Note (Signed)
-  Likely related to Azithromycin, will d/c and start doxycycline.

## 2018-03-12 ENCOUNTER — Encounter: Payer: Self-pay | Admitting: Family Medicine

## 2018-03-14 NOTE — Telephone Encounter (Signed)
Please have him f/u with me to discuss anxiety further and re-evaluate his drainage.

## 2018-03-18 ENCOUNTER — Other Ambulatory Visit: Payer: Self-pay | Admitting: Family Medicine

## 2018-03-18 ENCOUNTER — Encounter: Payer: Self-pay | Admitting: Family Medicine

## 2018-03-18 ENCOUNTER — Ambulatory Visit: Payer: Self-pay | Admitting: Family Medicine

## 2018-03-18 DIAGNOSIS — Z0289 Encounter for other administrative examinations: Secondary | ICD-10-CM

## 2018-03-18 MED ORDER — ALPRAZOLAM 0.5 MG PO TABS: 0.2500 mg | ORAL_TABLET | Freq: Every evening | ORAL | 1 refills | Status: DC | PRN

## 2018-03-18 NOTE — Telephone Encounter (Signed)
Refill of alprazolam sent in.  

## 2018-04-07 ENCOUNTER — Encounter: Payer: Self-pay | Admitting: Family Medicine

## 2018-04-22 ENCOUNTER — Encounter: Payer: Self-pay | Admitting: Family Medicine

## 2018-04-22 ENCOUNTER — Other Ambulatory Visit: Payer: Self-pay | Admitting: Family Medicine

## 2018-04-22 MED ORDER — ALPRAZOLAM 0.5 MG PO TABS: 0.2500 mg | ORAL_TABLET | Freq: Every evening | ORAL | 1 refills | Status: DC | PRN

## 2018-04-22 MED FILL — ALPRAZolam 0.5 MG TABS: 0.5 | 30 days supply | Qty: 30 | Fill #0

## 2018-04-22 NOTE — Telephone Encounter (Signed)
Rx for alprazolam renewed.   

## 2018-05-26 ENCOUNTER — Encounter: Payer: Self-pay | Admitting: Family Medicine

## 2018-05-26 NOTE — Telephone Encounter (Signed)
Refilled on 3/6 with 1 refill.  He should still have refill remaining on this.

## 2018-05-30 ENCOUNTER — Other Ambulatory Visit: Payer: Self-pay | Admitting: Family Medicine

## 2018-05-30 NOTE — Telephone Encounter (Signed)
It looks like he picked up refill on 4/10. Please have him schedule virtual visit with me to discuss further options for management of anxiety.

## 2018-06-16 ENCOUNTER — Encounter: Payer: Self-pay | Admitting: Family Medicine

## 2018-06-16 ENCOUNTER — Ambulatory Visit (INDEPENDENT_AMBULATORY_CARE_PROVIDER_SITE_OTHER): Payer: 59 | Admitting: Family Medicine

## 2018-06-16 DIAGNOSIS — F4322 Adjustment disorder with anxiety: Secondary | ICD-10-CM

## 2018-06-16 MED ORDER — ESCITALOPRAM OXALATE 10 MG PO TABS: 10.0000 mg | ORAL_TABLET | Freq: Every day | ORAL | 3 refills | Status: DC

## 2018-06-19 ENCOUNTER — Encounter: Payer: Self-pay | Admitting: Family Medicine

## 2018-06-19 NOTE — Assessment & Plan Note (Signed)
-  Continues with anxiety, insomnia managed well with alprazolam at bedtime but now with worsening anxiety with depressive symptoms related to ongoing stress of son being in NICU.   -Will add lexapro 10mg  -F/u in 1 month.

## 2018-06-19 NOTE — Progress Notes (Addendum)
Micheal Wilson - 28 y.o. male MRN [id number]  Date of birth: 12/03/1990   This visit type was conducted due to national recommendations for restrictions regarding the COVID-19 Pandemic (e.g. social distancing).  This format is felt to be most appropriate for this patient at this time.  All issues noted in this document were discussed and addressed.  No physical exam was performed (except for noted visual exam findings with Video Visits).  I discussed the limitations of evaluation and management by telemedicine and the availability of in person appointments. The patient expressed understanding and agreed to proceed.  I connected with@ on 06/16/2018 at  2:00 PM EDT by a video enabled telemedicine application and verified that I am speaking with the correct person using two identifiers.   Patient Location[place] [address] [place] [id number]   Provider location:   Home clinic  Chief Complaint  Patient presents with  . Follow-up    anxiety , PHQ9; 12   , GAD7 : 14    HPI  Micheal Wilson is a 28 y.o. male who presents via audio/video conferencing for a telehealth visit today.  He is following up today for anxiety.  He reports that his [facility].  Recently had another surgery.  Only can see child at limited times and one person at a time due to COVID.  This has contributed significantly to his anxiety.  Taking alprazolam nightly 0.5mg  which works well for him although feels like he needs something for anxiety throughout the day as well.  He denies side effects from alprazolam.   Depression screen [facility] 2/9 06/16/2018 10/25/2017  Decreased Interest 0 0  Down, Depressed, Hopeless 2 0  PHQ - 2 Score 2 0  Altered sleeping 3 -  Tired, decreased energy 0 -  Change in appetite 3 -  Feeling bad or failure about yourself  1 -  Trouble concentrating 0 -  Moving slowly or fidgety/restless 3 -  Suicidal thoughts 0 -  PHQ-9 Score 12 -  Difficult doing work/chores Not  difficult at all -   GAD 7 : Generalized Anxiety Score 06/16/2018  Nervous, Anxious, on Edge 2  Control/stop worrying 2  Worry too much - different things 2  Trouble relaxing 3  Restless 1  Easily annoyed or irritable 1  Afraid - awful might happen 3  Total GAD 7 Score 14  Anxiety Difficulty Not difficult at all         ROS:  A comprehensive ROS was completed and negative except as noted per HPI  Past Medical History:  Diagnosis Date  . Acute kidney injury (HCC)   . Allergy   . Heart murmur     Past Surgical History:  Procedure Laterality Date  . HERNIA REPAIR  1991    Family History  Problem Relation Age of Onset  . Diabetes Mother   . Asthma Mother   . Hypertension Mother   . Miscarriages / [place] Mother   . Hypertension Father   . Kidney disease Father   . Asthma Maternal Grandmother   . Hyperlipidemia Maternal Grandmother   . Hypertension Maternal Grandmother   . Heart disease Paternal Grandfather   . Hyperlipidemia Paternal Grandfather   . Hypertension Paternal Grandfather   . Kidney disease Paternal Grandfather     Social History   Socioeconomic History  . Marital status: Married    Spouse name: Not on file  . Number of children: Not on file  .  Years of education: Not on file  . Highest education level: Not on file  Occupational History  . Not on file  Social Needs  . Financial resource strain: Not on file  . Food insecurity:    Worry: Not on file    Inability: Not on file  . Transportation needs:    Medical: Not on file    Non-medical: Not on file  Tobacco Use  . Smoking status: Former Smoker    Packs/day: 1.00    Years: 0.90    Pack years: 0.90    Types: Pipe  . Smokeless tobacco: Never Used  Substance and Sexual Activity  . Alcohol use: Yes    Comment: rarely  . Drug use: No  . Sexual activity: Not on file  Lifestyle  . Physical activity:    Days per week: Not on file    Minutes per session: Not on file  . Stress: Not on  file  Relationships  . Social connections:    Talks on phone: Not on file    Gets together: Not on file    Attends religious service: Not on file    Active member of club or organization: Not on file    Attends meetings of clubs or organizations: Not on file    Relationship status: Not on file  . Intimate partner violence:    Fear of current or ex partner: Not on file    Emotionally abused: Not on file    Physically abused: Not on file    Forced sexual activity: Not on file  Other Topics Concern  . Not on file  Social History Narrative   Lives in [place] with wife   Works as a Company secretary     Current Outpatient Medications:  .  ALPRAZolam (XANAX) 0.5 MG tablet, Take 0.5-1 tablets (0.25-0.5 mg total) by mouth at bedtime as needed for anxiety or sleep., Disp: 30 tablet, Rfl: 1 .  doxycycline (VIBRA-TABS) 100 MG tablet, Take 1 tablet (100 mg total) by mouth 2 (two) times daily. (Patient not taking: Reported on 06/16/2018), Disp: 20 tablet, Rfl: 0 .  escitalopram (LEXAPRO) 10 MG tablet, Take 1 tablet (10 mg total) by mouth at bedtime., Disp: 30 tablet, Rfl: 3 .  magic mouthwash SOLN, Take 5 mLs by mouth 3 (three) times daily as needed for mouth pain. (Patient not taking: Reported on 06/16/2018), Disp: 150 mL, Rfl: 0  EXAM:  VITALS per patient if applicable: Pulse 96   Ht 5\' 7"  (1.702 m)   Wt 154 lb (69.9 kg)   BMI 24.12 kg/m   GENERAL: alert, oriented, appears well and in no acute distress  HEENT: atraumatic, conjunttiva clear, no obvious abnormalities on inspection of external nose and ears  NECK: normal movements of the head and neck  LUNGS: on inspection no signs of respiratory distress, breathing rate appears normal, no obvious gross SOB, gasping or wheezing  CV: no obvious cyanosis  MS: moves all visible extremities without noticeable abnormality  PSYCH/NEURO: pleasant and cooperative, no obvious depression or anxiety, speech and thought processing grossly intact   ASSESSMENT AND PLAN:  Discussed the following assessment and plan:  Adjustment disorder with anxiety -Continues with anxiety, insomnia managed well with alprazolam at bedtime but now with worsening anxiety with depressive symptoms related to ongoing stress of son being in NICU.   -Will add lexapro 10mg  -F/u in 1 month.         I discussed the assessment and treatment plan with the patient.  The patient was provided an opportunity to ask questions and all were answered. The patient agreed with the plan and demonstrated an understanding of the instructions.   The patient was advised to call back or seek an in-person evaluation if the symptoms worsen or if the condition fails to improve as anticipated.    Everrett Coombe, DO

## 2018-06-30 ENCOUNTER — Encounter: Payer: Self-pay | Admitting: Family Medicine

## 2018-06-30 ENCOUNTER — Other Ambulatory Visit: Payer: Self-pay | Admitting: Family Medicine

## 2018-06-30 MED ORDER — ALPRAZOLAM 0.5 MG PO TABS: 0.2500 mg | ORAL_TABLET | Freq: Every evening | ORAL | 1 refills | Status: DC | PRN

## 2018-06-30 NOTE — Telephone Encounter (Signed)
Ok to continue with current medication, refill sent in.

## 2018-07-01 ENCOUNTER — Other Ambulatory Visit: Payer: Self-pay | Admitting: Family Medicine

## 2018-07-01 MED ORDER — ALPRAZOLAM 0.5 MG PO TABS: 0.2500 mg | ORAL_TABLET | Freq: Every evening | ORAL | 1 refills | Status: DC | PRN

## 2018-08-17 ENCOUNTER — Other Ambulatory Visit: Payer: Self-pay | Admitting: Family Medicine

## 2018-08-17 ENCOUNTER — Encounter: Payer: Self-pay | Admitting: Family Medicine

## 2018-08-17 MED ORDER — ALPRAZOLAM 0.5 MG PO TABS: 0.2500 mg | ORAL_TABLET | Freq: Every evening | ORAL | 1 refills | Status: DC | PRN

## 2018-08-17 MED FILL — ALPRAZolam 0.5 MG TABS: 0.5 | 30 days supply | Qty: 30 | Fill #0

## 2018-09-24 ENCOUNTER — Encounter: Payer: Self-pay | Admitting: Family Medicine

## 2018-09-27 NOTE — Telephone Encounter (Signed)
Needs f/u.  Can be VV

## 2018-10-05 MED FILL — ALPRAZolam 0.5 MG TABS: 0.5 | 30 days supply | Qty: 30 | Fill #1

## 2018-11-14 ENCOUNTER — Other Ambulatory Visit: Payer: Self-pay | Admitting: Family Medicine

## 2018-11-14 MED FILL — ALPRAZolam 0.5 MG TABS: 0.5 | 30 days supply | Qty: 30 | Fill #0

## 2018-11-14 NOTE — Telephone Encounter (Signed)
Please advise on refill request

## 2018-12-28 ENCOUNTER — Other Ambulatory Visit: Payer: Self-pay | Admitting: Family Medicine

## 2018-12-29 ENCOUNTER — Other Ambulatory Visit: Payer: Self-pay | Admitting: Family Medicine

## 2018-12-29 ENCOUNTER — Encounter: Payer: Self-pay | Admitting: Family Medicine

## 2018-12-29 MED ORDER — ALPRAZOLAM 0.5 MG PO TABS: ORAL_TABLET | ORAL | 0 refills | Status: DC

## 2018-12-29 MED FILL — ALPRAZolam 0.5 MG TABS: 0.5 | 30 days supply | Qty: 30 | Fill #0

## 2019-01-25 ENCOUNTER — Other Ambulatory Visit: Payer: Self-pay

## 2019-01-25 DIAGNOSIS — Z20822 Contact with and (suspected) exposure to covid-19: Secondary | ICD-10-CM

## 2019-01-26 LAB — NOVEL CORONAVIRUS, NAA: SARS-CoV-2, NAA: NOT DETECTED

## 2019-02-07 ENCOUNTER — Encounter: Payer: Self-pay | Admitting: Family Medicine

## 2019-02-07 ENCOUNTER — Other Ambulatory Visit: Payer: Self-pay

## 2019-02-07 ENCOUNTER — Other Ambulatory Visit: Payer: Self-pay | Admitting: Family Medicine

## 2019-02-07 ENCOUNTER — Telehealth (INDEPENDENT_AMBULATORY_CARE_PROVIDER_SITE_OTHER): Payer: 59 | Admitting: Family Medicine

## 2019-02-07 DIAGNOSIS — F4322 Adjustment disorder with anxiety: Secondary | ICD-10-CM

## 2019-02-07 MED ORDER — ALPRAZOLAM 0.5 MG PO TABS: ORAL_TABLET | ORAL | 0 refills | Status: DC

## 2019-02-07 MED ORDER — TRAZODONE HCL 50 MG PO TABS: 50.0000 mg | ORAL_TABLET | Freq: Every evening | ORAL | 3 refills | Status: DC | PRN

## 2019-02-07 MED FILL — ALPRAZolam 0.5 MG TABS: 0.5 | 10 days supply | Qty: 10 | Fill #0

## 2019-02-07 MED FILL — traZODone HCL 50 MG TABS: 50 | 30 days supply | Qty: 60 | Fill #0

## 2019-02-07 NOTE — Assessment & Plan Note (Signed)
Continues to have insomnia related to anxiety.  Discussed that I would not prescribe alprazolam long term for management of his insomnia as this was mainly to help with stress and anxiety related to the loss of his son.   Does not wish to try lexapro or anything similar again.  He is willing to try trazodone at bedtime.  Will wean from alprazolam.  Start trazodone 50-100mg  at bedtime.  Follow up in 1 month.

## 2019-02-07 NOTE — Telephone Encounter (Signed)
Checking with Dr. Zigmund Daniel to see if virtual visit is okey?

## 2019-02-07 NOTE — Telephone Encounter (Signed)
Yes virtual visit is fine

## 2019-02-07 NOTE — Progress Notes (Signed)
Micheal Wilson - 28 y.o. male MRN [id number]  Date of birth: [birth year]   This visit type was conducted due to national recommendations for restrictions regarding the COVID-19 Pandemic (e.g. social distancing).  This format is felt to be most appropriate for this patient at this time.  All issues noted in this document were discussed and addressed.  No physical exam was performed (except for noted visual exam findings with Video Visits).  I discussed the limitations of evaluation and management by telemedicine and the availability of in person appointments. The patient expressed understanding and agreed to proceed.  I connected with@ on 02/07/19 at 11:20 AM EST by a video enabled telemedicine application and verified that I am speaking with the correct person using two identifiers.  Present at visit: Luetta Nutting, DO Micheal Wilson   Patient Location: Home [address] [place] [address]   Provider location:   Home office  Chief Complaint  Patient presents with  . Medication Refill    refill for XANAX--not taking lexapro due to side effects.     HPI  Micheal Wilson is a 28 y.o. male who presents via audio/video conferencing for a telehealth visit today.  He is following up today for anxiety and insomnia.  He reports that he needs a refill of alprazolam.  He was tried on lexapro previously as well however states that he did not feel good taking it so he quit after about 3 weeks.  He has continue alprazolam at bedtime.  States he doesn't take this every night but does use most nights. He has tried OTC medications for sleep without success.      ROS:  A comprehensive ROS was completed and negative except as noted per HPI  Past Medical History:  Diagnosis Date  . Acute kidney injury ([facility])   . Allergy   . Heart murmur     Past Surgical History:  Procedure Laterality Date  . HERNIA REPAIR  1991    Family History  Problem Relation Age of Onset  . Diabetes Mother   .  Asthma Mother   . Hypertension Mother   . Miscarriages / [place] Mother   . Hypertension Father   . Kidney disease Father   . Asthma Maternal Grandmother   . Hyperlipidemia Maternal Grandmother   . Hypertension Maternal Grandmother   . Heart disease Paternal Grandfather   . Hyperlipidemia Paternal Grandfather   . Hypertension Paternal Grandfather   . Kidney disease Paternal Grandfather     Social History   Socioeconomic History  . Marital status: Married    Spouse name: Not on file  . Number of children: Not on file  . Years of education: Not on file  . Highest education level: Not on file  Occupational History  . Not on file  Tobacco Use  . Smoking status: Former Smoker    Packs/day: 1.00    Years: 0.90    Pack years: 0.90    Types: Pipe  . Smokeless tobacco: Never Used  Substance and Sexual Activity  . Alcohol use: Yes    Comment: rarely  . Drug use: No  . Sexual activity: Not on file  Other Topics Concern  . Not on file  Social History Narrative   Lives in [place] with wife   Works as a Agricultural consultant of Radio broadcast assistant Strain:   . Difficulty of Paying Living Expenses: Not on file  Food Insecurity:   . Worried About Estate manager/land agent  of Food in the Last Year: Not on file  . Ran Out of Food in the Last Year: Not on file  Transportation Needs:   . Lack of Transportation (Medical): Not on file  . Lack of Transportation (Non-Medical): Not on file  Physical Activity:   . Days of Exercise per Week: Not on file  . Minutes of Exercise per Session: Not on file  Stress:   . Feeling of Stress : Not on file  Social Connections:   . Frequency of Communication with Friends and Family: Not on file  . Frequency of Social Gatherings with Friends and Family: Not on file  . Attends Religious Services: Not on file  . Active Member of Clubs or Organizations: Not on file  . Attends Banker Meetings: Not on file  . Marital Status:  Not on file  Intimate Partner Violence:   . Fear of Current or Ex-Partner: Not on file  . Emotionally Abused: Not on file  . Physically Abused: Not on file  . Sexually Abused: Not on file     Current Outpatient Medications:  .  ALPRAZolam (XANAX) 0.5 MG tablet, Take 1/2 tablet at bedtime x3 days then 1/2 tablet every other night x1 week., Disp: 10 tablet, Rfl: 0 .  traZODone (DESYREL) 50 MG tablet, Take 1-2 tablets (50-100 mg total) by mouth at bedtime as needed for sleep., Disp: 60 tablet, Rfl: 3  EXAM:  VITALS per patient if applicable: Temp 97.6 F (36.4 C) (Oral) Comment: pt reprt yesterday reading  Ht 5\' 7"  (1.702 m)   BMI 24.12 kg/m   GENERAL: alert, oriented, appears well and in no acute distress  HEENT: atraumatic, conjunttiva clear, no obvious abnormalities on inspection of external nose and ears  NECK: normal movements of the head and neck  LUNGS: on inspection no signs of respiratory distress, breathing rate appears normal, no obvious gross SOB, gasping or wheezing  CV: no obvious cyanosis  MS: moves all visible extremities without noticeable abnormality  PSYCH/NEURO: pleasant and cooperative, no obvious depression or anxiety, speech and thought processing grossly intact  ASSESSMENT AND PLAN:  Discussed the following assessment and plan:  Adjustment disorder with anxiety Continues to have insomnia related to anxiety.  Discussed that I would not prescribe alprazolam long term for management of his insomnia as this was mainly to help with stress and anxiety related to the loss of his son.   Does not wish to try lexapro or anything similar again.  He is willing to try trazodone at bedtime.  Will wean from alprazolam.  Start trazodone 50-100mg  at bedtime.  Follow up in 1 month.     I discussed the assessment and treatment plan with the patient. The patient was provided an opportunity to ask questions and all were answered. The patient agreed with the plan and  demonstrated an understanding of the instructions.   The patient was advised to call back or seek an in-person evaluation if the symptoms worsen or if the condition fails to improve as anticipated.    , DO

## 2019-03-24 ENCOUNTER — Other Ambulatory Visit: Payer: 59

## 2019-04-03 ENCOUNTER — Other Ambulatory Visit: Payer: Self-pay

## 2019-04-04 ENCOUNTER — Ambulatory Visit: Payer: 59 | Admitting: Family Medicine

## 2019-04-13 ENCOUNTER — Ambulatory Visit (INDEPENDENT_AMBULATORY_CARE_PROVIDER_SITE_OTHER): Payer: 59 | Admitting: Family Medicine

## 2019-04-13 ENCOUNTER — Encounter: Payer: Self-pay | Admitting: Family Medicine

## 2019-04-13 ENCOUNTER — Other Ambulatory Visit: Payer: Self-pay

## 2019-04-13 VITALS — BP 106/69 | HR 80 | Temp 97.8°F | Ht 67.0 in | Wt 153.0 lb

## 2019-04-13 DIAGNOSIS — Z0000 Encounter for general adult medical examination without abnormal findings: Secondary | ICD-10-CM | POA: Diagnosis not present

## 2019-04-13 DIAGNOSIS — Z13220 Encounter for screening for lipoid disorders: Secondary | ICD-10-CM | POA: Diagnosis not present

## 2019-04-13 LAB — CBC
HCT: 42.9 % (ref 38.5–50.0)
Hemoglobin: 15.4 g/dL (ref 13.2–17.1)
MCH: 30.2 pg (ref 27.0–33.0)
MCHC: 35.9 g/dL (ref 32.0–36.0)
MCV: 84.1 fL (ref 80.0–100.0)
MPV: 9.8 fL (ref 7.5–12.5)
Platelets: 174 10*3/uL (ref 140–400)
RBC: 5.1 10*6/uL (ref 4.20–5.80)
RDW: 12.8 % (ref 11.0–15.0)
WBC: 5.5 10*3/uL (ref 3.8–10.8)

## 2019-04-13 LAB — COMPLETE METABOLIC PANEL WITH GFR
AG Ratio: 2.2 (calc) (ref 1.0–2.5)
ALT: 25 U/L (ref 9–46)
AST: 20 U/L (ref 10–40)
Albumin: 4.7 g/dL (ref 3.6–5.1)
Alkaline phosphatase (APISO): 97 U/L (ref 36–130)
BUN: 11 mg/dL (ref 7–25)
CO2: 26 mmol/L (ref 20–32)
Calcium: 9.4 mg/dL (ref 8.6–10.3)
Chloride: 106 mmol/L (ref 98–110)
Creat: 1.12 mg/dL (ref 0.60–1.35)
GFR, Est African American: 102 mL/min/{1.73_m2} (ref >=60)
GFR, Est Non African American: 88 mL/min/{1.73_m2} (ref >=60)
Globulin: 2.1 g/dL (calc) (ref 1.9–3.7)
Glucose, Bld: 102 mg/dL — ABNORMAL HIGH (ref 65–99)
Potassium: 4.3 mmol/L (ref 3.5–5.3)
Sodium: 140 mmol/L (ref 135–146)
Total Bilirubin: 0.8 mg/dL (ref 0.2–1.2)
Total Protein: 6.8 g/dL (ref 6.1–8.1)

## 2019-04-13 LAB — LIPID PANEL
Cholesterol: 150 mg/dL (ref <200)
HDL: 38 mg/dL — ABNORMAL LOW (ref >=40)
LDL Cholesterol (Calc): 98 mg/dL (calc)
Non-HDL Cholesterol (Calc): 112 mg/dL (calc) (ref <130)
Total CHOL/HDL Ratio: 3.9 (calc) (ref <5.0)
Triglycerides: 46 mg/dL (ref <150)

## 2019-04-13 NOTE — Assessment & Plan Note (Signed)
Well adult Orders Placed This Encounter  Procedures  . COMPLETE METABOLIC PANEL WITH GFR  . CBC  . Lipid Profile  Screening: lipid panel Immunizations: UTD Anticipatory guidance/Risk factor reduction:  Recommendations per AVS.

## 2019-04-13 NOTE — Progress Notes (Signed)
Micheal Wilson - 29 y.o. male MRN [id number]  Date of birth: 06-02-1990  Subjective Chief Complaint  Patient presents with  . Annual Exam    HPI Micheal Allmon is a 29 y.o. male with history of adjustment disorder and insomnia here today for annual exam.  He reports that he is doing well.  He is using trazodone nightly for sleep, this is working well for him.  He is rarely using alprazolam now.  He denies new concerns at this time.   Currently working in Architect.  He denies increased stress or anxiety.   His job keeps him very active.  He does not really pay attention to what he eats.   Review of Systems  Constitutional: Negative for chills, fever, malaise/fatigue and weight loss.  HENT: Negative for congestion, ear pain and sore throat.   Eyes: Negative for blurred vision, double vision and pain.  Respiratory: Negative for cough and shortness of breath.   Cardiovascular: Negative for chest pain and palpitations.  Gastrointestinal: Negative for abdominal pain, blood in stool, constipation, heartburn and nausea.  Genitourinary: Negative for dysuria and urgency.  Musculoskeletal: Negative for joint pain and myalgias.  Neurological: Negative for dizziness and headaches.  Endo/Heme/Allergies: Does not bruise/bleed easily.  Psychiatric/Behavioral: Negative for depression. The patient is not nervous/anxious and does not have insomnia.      Allergies  Allergen Reactions  . Omnicef [Cefdinir] Swelling    Lymph nodes swell    Past Medical History:  Diagnosis Date  . Acute kidney injury ([facility])   . Allergy   . Heart murmur     Past Surgical History:  Procedure Laterality Date  . HERNIA REPAIR  1991    Social History   Socioeconomic History  . Marital status: Married    Spouse name: Not on file  . Number of children: Not on file  . Years of education: Not on file  . Highest education level: Not on file  Occupational History  . Not on file  Tobacco Use  . Smoking status:  Former Smoker    Packs/day: 1.00    Years: 0.90    Pack years: 0.90    Types: Pipe  . Smokeless tobacco: Never Used  Substance and Sexual Activity  . Alcohol use: Yes    Comment: rarely  . Drug use: No  . Sexual activity: Not on file  Other Topics Concern  . Not on file  Social History Narrative   Lives in [place] with wife   Works as a Agricultural consultant of Radio broadcast assistant Strain:   . Difficulty of Paying Living Expenses: Not on file  Food Insecurity:   . Worried About Charity fundraiser in the Last Year: Not on file  . Ran Out of Food in the Last Year: Not on file  Transportation Needs:   . Lack of Transportation (Medical): Not on file  . Lack of Transportation (Non-Medical): Not on file  Physical Activity:   . Days of Exercise per Week: Not on file  . Minutes of Exercise per Session: Not on file  Stress:   . Feeling of Stress : Not on file  Social Connections:   . Frequency of Communication with Friends and Family: Not on file  . Frequency of Social Gatherings with Friends and Family: Not on file  . Attends Religious Services: Not on file  . Active Member of Clubs or Organizations: Not on file  . Attends Archivist Meetings: Not  on file  . Marital Status: Not on file    Family History  Problem Relation Age of Onset  . Diabetes Mother   . Asthma Mother   . Hypertension Mother   . Miscarriages / [place] Mother   . Hypertension Father   . Kidney disease Father   . Asthma Maternal Grandmother   . Hyperlipidemia Maternal Grandmother   . Hypertension Maternal Grandmother   . Heart disease Paternal Grandfather   . Hyperlipidemia Paternal Grandfather   . Hypertension Paternal Grandfather   . Kidney disease Paternal Grandfather     Health Maintenance  Topic Date Due  . TETANUS/TDAP  10/26/2027  . INFLUENZA VACCINE  Completed  . HIV Screening  Completed      ----------------------------------------------------------------------------------------------------------------------------------------------------------------------------------------------------------------- Physical Exam BP 106/69   Pulse 80   Temp 97.8 F (36.6 C) (Oral)   Ht 5\' 7"  (1.702 m)   Wt 153 lb (69.4 kg)   BMI 23.96 kg/m   Physical Exam Constitutional:      General: He is not in acute distress.    Appearance: Normal appearance.  HENT:     Head: Normocephalic and atraumatic.     Right Ear: External ear normal.     Left Ear: External ear normal.  Eyes:     General: No scleral icterus. Neck:     Thyroid: No thyromegaly.  Cardiovascular:     Rate and Rhythm: Normal rate and regular rhythm.     Heart sounds: Normal heart sounds.  Pulmonary:     Effort: Pulmonary effort is normal.     Breath sounds: Normal breath sounds.  Abdominal:     General: Bowel sounds are normal. There is no distension.     Palpations: Abdomen is soft.     Tenderness: There is no abdominal tenderness. There is no guarding.  Musculoskeletal:     Cervical back: Normal range of motion.  Lymphadenopathy:     Cervical: No cervical adenopathy.  Skin:    General: Skin is warm and dry.     Findings: No rash.  Neurological:     Mental Status: He is alert and oriented to person, place, and time.     Cranial Nerves: No cranial nerve deficit.     Motor: No abnormal muscle tone.  Psychiatric:        Mood and Affect: Mood normal.        Behavior: Behavior normal.     ------------------------------------------------------------------------------------------------------------------------------------------------------------------------------------------------------------------- Assessment and Plan  Well adult exam Well adult Orders Placed This Encounter  Procedures  . COMPLETE METABOLIC PANEL WITH GFR  . CBC  . Lipid Profile  Screening: lipid panel Immunizations: UTD Anticipatory  guidance/Risk factor reduction:  Recommendations per AVS.    No orders of the defined types were placed in this encounter.   No follow-ups on file.    This visit occurred during the SARS-CoV-2 public health emergency.  Safety protocols were in place, including screening questions prior to the visit, additional usage of staff PPE, and extensive cleaning of exam room while observing appropriate contact time as indicated for disinfecting solutions.

## 2019-04-13 NOTE — Patient Instructions (Signed)
Preventive Care 19-30 Years Old, Male Preventive care refers to lifestyle choices and visits with your health care provider that can promote health and wellness. This includes:  A yearly physical exam. This is also called an annual well check.  Regular dental and eye exams.  Immunizations.  Screening for certain conditions.  Healthy lifestyle choices, such as eating a healthy diet, getting regular exercise, not using drugs or products that contain nicotine and tobacco, and limiting alcohol use. What can I expect for my preventive care visit? Physical exam Your health care provider will check:  Height and weight. These may be used to calculate body mass index (BMI), which is a measurement that tells if you are at a healthy weight.  Heart rate and blood pressure.  Your skin for abnormal spots. Counseling Your health care provider may ask you questions about:  Alcohol, tobacco, and drug use.  Emotional well-being.  Home and relationship well-being.  Sexual activity.  Eating habits.  Work and work Statistician. What immunizations do I need?  Influenza (flu) vaccine  This is recommended every year. Tetanus, diphtheria, and pertussis (Tdap) vaccine  You may need a Td booster every 10 years. Varicella (chickenpox) vaccine  You may need this vaccine if you have not already been vaccinated. Human papillomavirus (HPV) vaccine  If recommended by your health care provider, you may need three doses over 6 months. Measles, mumps, and rubella (MMR) vaccine  You may need at least one dose of MMR. You may also need a second dose. Meningococcal conjugate (MenACWY) vaccine  One dose is recommended if you are 45-76 years old and a Market researcher living in a residence hall, or if you have one of several medical conditions. You may also need additional booster doses. Pneumococcal conjugate (PCV13) vaccine  You may need this if you have certain conditions and were not  previously vaccinated. Pneumococcal polysaccharide (PPSV23) vaccine  You may need one or two doses if you smoke cigarettes or if you have certain conditions. Hepatitis A vaccine  You may need this if you have certain conditions or if you travel or work in places where you may be exposed to hepatitis A. Hepatitis B vaccine  You may need this if you have certain conditions or if you travel or work in places where you may be exposed to hepatitis B. Haemophilus influenzae type b (Hib) vaccine  You may need this if you have certain risk factors. You may receive vaccines as individual doses or as more than one vaccine together in one shot (combination vaccines). Talk with your health care provider about the risks and benefits of combination vaccines. What tests do I need? Blood tests  Lipid and cholesterol levels. These may be checked every 5 years starting at age 17.  Hepatitis C test.  Hepatitis B test. Screening   Diabetes screening. This is done by checking your blood sugar (glucose) after you have not eaten for a while (fasting).  Sexually transmitted disease (STD) testing. Talk with your health care provider about your test results, treatment options, and if necessary, the need for more tests. Follow these instructions at home: Eating and drinking   Eat a diet that includes fresh fruits and vegetables, whole grains, lean protein, and low-fat dairy products.  Take vitamin and mineral supplements as recommended by your health care provider.  Do not drink alcohol if your health care provider tells you not to drink.  If you drink alcohol: ? Limit how much you have to 0-2  drinks a day. ? Be aware of how much alcohol is in your drink. In the [place] one drink equals one 12 oz bottle of beer (355 mL), one 5 oz glass of wine (148 mL), or one 1 oz glass of hard liquor (44 mL). Lifestyle  Take daily care of your teeth and gums.  Stay active. Exercise for at least 30 minutes on 5 or  more days each week.  Do not use any products that contain nicotine or tobacco, such as cigarettes, e-cigarettes, and chewing tobacco. If you need help quitting, ask your health care provider.  If you are sexually active, practice safe sex. Use a condom or other form of protection to prevent STIs (sexually transmitted infections). What's next?  Go to your health care provider once a year for a well check visit.  Ask your health care provider how often you should have your eyes and teeth checked.  Stay up to date on all vaccines. This information is not intended to replace advice given to you by your health care provider. Make sure you discuss any questions you have with your health care provider. Document Revised: 01/27/2018 Document Reviewed: 01/27/2018 Elsevier Patient Education  2020 [employer].

## 2019-11-17 ENCOUNTER — Other Ambulatory Visit: Payer: Self-pay | Admitting: Family Medicine

## 2019-11-17 ENCOUNTER — Ambulatory Visit (INDEPENDENT_AMBULATORY_CARE_PROVIDER_SITE_OTHER): Payer: 59 | Admitting: Family Medicine

## 2019-11-17 DIAGNOSIS — Z8616 Personal history of COVID-19: Secondary | ICD-10-CM | POA: Diagnosis not present

## 2019-11-17 DIAGNOSIS — F411 Generalized anxiety disorder: Secondary | ICD-10-CM

## 2019-11-17 MED ORDER — ALBUTEROL SULFATE HFA 108 (90 BASE) MCG/ACT IN AERS: 2.0000 | INHALATION_SPRAY | Freq: Four times a day (QID) | RESPIRATORY_TRACT | 0 refills | Status: DC | PRN

## 2019-11-17 MED ORDER — HYDROXYZINE PAMOATE 25 MG PO CAPS: 25.0000 mg | ORAL_CAPSULE | Freq: Three times a day (TID) | ORAL | 0 refills | Status: DC | PRN

## 2019-11-17 MED FILL — HYDROXYZINE PAMOATE 25 MG C: 25 | 10 days supply | Qty: 30 | Fill #0

## 2019-11-17 MED FILL — ALBUTEROL SULFATE HFA 108 (: 108 (90 BAS | 25 days supply | Qty: 18 | Fill #0

## 2019-11-19 ENCOUNTER — Encounter: Payer: Self-pay | Admitting: Family Medicine

## 2019-11-19 DIAGNOSIS — F411 Generalized anxiety disorder: Secondary | ICD-10-CM | POA: Insufficient documentation

## 2019-11-19 DIAGNOSIS — Z8616 Personal history of COVID-19: Secondary | ICD-10-CM | POA: Insufficient documentation

## 2019-11-19 NOTE — Progress Notes (Signed)
Micheal Wilson - 29 y.o. male MRN [id number]  Date of birth: 1990/12/05  Subjective No chief complaint on file.   HPI. Micheal Wilson is a 29 y.o. male here today for follow up of COVID-19 infection.  He reports having infection about 1 month ago.  He had mild symptoms overall and had fully recovered for the most part.  He does have some occasional wheezing however he does also vape.  He denies shortness of breath, cough or chest pain/tightness.   He also continues to have intermittent anxiety.  Previously taking alprazolam as needed after the death of his son.  He did try lexapro but didn't tolerate this very well.    ROS:  A comprehensive ROS was completed and negative except as noted per HPI   Allergies  Allergen Reactions  . Omnicef [Cefdinir] Swelling    Lymph nodes swell    Past Medical History:  Diagnosis Date  . Acute kidney injury ([facility])   . Allergy   . Heart murmur     Past Surgical History:  Procedure Laterality Date  . HERNIA REPAIR  1991    Social History   Socioeconomic History  . Marital status: Married    Spouse name: Not on file  . Number of children: Not on file  . Years of education: Not on file  . Highest education level: Not on file  Occupational History  . Not on file  Tobacco Use  . Smoking status: Former Smoker    Packs/day: 1.00    Years: 0.90    Pack years: 0.90    Types: Pipe  . Smokeless tobacco: Never Used  Vaping Use  . Vaping Use: Every day  Substance and Sexual Activity  . Alcohol use: Yes    Comment: rarely  . Drug use: No  . Sexual activity: Not on file  Other Topics Concern  . Not on file  Social History Narrative   Lives in [place] with wife   Works as a Engineer, maintenance (IT) of Corporate investment banker Strain:   . Difficulty of Paying Living Expenses: Not on file  Food Insecurity:   . Worried About Programme researcher, broadcasting/film/video in the Last Year: Not on file  . Ran Out of Food in the Last Year: Not on file   Transportation Needs:   . Lack of Transportation (Medical): Not on file  . Lack of Transportation (Non-Medical): Not on file  Physical Activity:   . Days of Exercise per Week: Not on file  . Minutes of Exercise per Session: Not on file  Stress:   . Feeling of Stress : Not on file  Social Connections:   . Frequency of Communication with Friends and Family: Not on file  . Frequency of Social Gatherings with Friends and Family: Not on file  . Attends Religious Services: Not on file  . Active Member of Clubs or Organizations: Not on file  . Attends Banker Meetings: Not on file  . Marital Status: Not on file    Family History  Problem Relation Age of Onset  . Diabetes Mother   . Asthma Mother   . Hypertension Mother   . Miscarriages / [place] Mother   . Hypertension Father   . Kidney disease Father   . Asthma Maternal Grandmother   . Hyperlipidemia Maternal Grandmother   . Hypertension Maternal Grandmother   . Heart disease Paternal Grandfather   . Hyperlipidemia Paternal Grandfather   . Hypertension Paternal Grandfather   .  Kidney disease Paternal Grandfather     Health Maintenance  Topic Date Due  . COVID-19 Vaccine (1) 12/03/2019 (Originally 03/21/2001)  . INFLUENZA VACCINE  05/16/2020 (Originally 09/17/2019)  . TETANUS/TDAP  10/26/2027  . Hepatitis C Screening  Completed  . HIV Screening  Completed     ----------------------------------------------------------------------------------------------------------------------------------------------------------------------------------------------------------------- Physical Exam BP 111/71 (BP Location: Left Arm, Patient Position: Sitting)   Pulse 76   Temp 98 F (36.7 C)   Wt 159 lb (72.1 kg)   SpO2 100%   BMI 24.90 kg/m   Physical Exam Constitutional:      Appearance: Normal appearance.  Cardiovascular:     Rate and Rhythm: Normal rate and regular rhythm.  Pulmonary:     Effort: Pulmonary effort  is normal.     Breath sounds: Normal breath sounds.  Musculoskeletal:     Cervical back: Neck supple.  Skin:    General: Skin is warm and dry.  Neurological:     General: No focal deficit present.     Mental Status: He is alert.  Psychiatric:        Mood and Affect: Mood normal.        Behavior: Behavior normal.     ------------------------------------------------------------------------------------------------------------------------------------------------------------------------------------------------------------------- Assessment and Plan  History of COVID-19 He seems to have recovered fairly well from COVID.  Some intermittent wheezing but likely related to vaping.  Discussed considering vaccination in a few months as his natural immunity may wane,  however he does not believe this is necessary or effective.   Rx for albuterol as needed. Recommend that he avoid vaping.    GAD (generalized anxiety disorder) He does not want to take a medication daily at this time.  I don't think he needs to restart alprazolam at this time.  Will start vistaril as needed for anxiety.    Meds ordered this encounter  Medications  . albuterol (VENTOLIN HFA) 108 (90 Base) MCG/ACT inhaler    Sig: Inhale 2 puffs into the lungs every 6 (six) hours as needed for wheezing or shortness of breath.    Dispense:  8 g    Refill:  0  . hydrOXYzine (VISTARIL) 25 MG capsule    Sig: Take 1 capsule (25 mg total) by mouth 3 (three) times daily as needed.    Dispense:  30 capsule    Refill:  0    No follow-ups on file.    This visit occurred during the SARS-CoV-2 public health emergency.  Safety protocols were in place, including screening questions prior to the visit, additional usage of staff PPE, and extensive cleaning of exam room while observing appropriate contact time as indicated for disinfecting solutions.

## 2019-11-19 NOTE — Assessment & Plan Note (Addendum)
He seems to have recovered fairly well from COVID.  Some intermittent wheezing but likely related to vaping.  Discussed considering vaccination in a few months as his natural immunity may wane,  however he does not believe this is necessary or effective.   Rx for albuterol as needed. Recommend that he avoid vaping.

## 2019-11-19 NOTE — Assessment & Plan Note (Signed)
He does not want to take a medication daily at this time.  I don't think he needs to restart alprazolam at this time.  Will start vistaril as needed for anxiety.

## 2019-12-15 MED FILL — ALBUTEROL SULFATE HFA 108 (: 108 (90 BAS | 25 days supply | Qty: 18 | Fill #0

## 2019-12-15 MED FILL — HYDROXYZINE PAMOATE 25 MG C: 25 | 10 days supply | Qty: 30 | Fill #0
